# Patient Record
Sex: Male | Born: 1980 | Race: White | Hispanic: No | Marital: Married | Smoking: Never smoker
Health system: Southern US, Community
[De-identification: ages and names within clinical notes are randomized; demographics above are authoritative.]

---

## 2017-03-29 DIAGNOSIS — Z0000 Encounter for general adult medical examination without abnormal findings: Secondary | ICD-10-CM | POA: Diagnosis not present

## 2017-03-29 DIAGNOSIS — Z136 Encounter for screening for cardiovascular disorders: Secondary | ICD-10-CM | POA: Diagnosis not present

## 2017-05-29 DIAGNOSIS — Z23 Encounter for immunization: Secondary | ICD-10-CM | POA: Diagnosis not present

## 2017-06-12 DIAGNOSIS — Z0000 Encounter for general adult medical examination without abnormal findings: Secondary | ICD-10-CM | POA: Diagnosis not present

## 2017-09-17 ENCOUNTER — Ambulatory Visit
Admission: RE | Admit: 2017-09-17 | Discharge: 2017-09-17 | Disposition: A | Discharge status: Home / Self Care | Payer: 59 | Source: Ambulatory Visit | Attending: Physician Assistant | Admitting: Physician Assistant

## 2017-09-17 ENCOUNTER — Other Ambulatory Visit: Payer: Self-pay | Admitting: Physician Assistant

## 2017-09-17 DIAGNOSIS — R937 Abnormal findings on diagnostic imaging of other parts of musculoskeletal system: Secondary | ICD-10-CM | POA: Diagnosis not present

## 2017-09-17 DIAGNOSIS — M25571 Pain in right ankle and joints of right foot: Secondary | ICD-10-CM

## 2017-09-17 DIAGNOSIS — R600 Localized edema: Secondary | ICD-10-CM | POA: Diagnosis not present

## 2017-12-03 DIAGNOSIS — J351 Hypertrophy of tonsils: Secondary | ICD-10-CM | POA: Diagnosis not present

## 2018-04-26 DIAGNOSIS — L819 Disorder of pigmentation, unspecified: Secondary | ICD-10-CM | POA: Diagnosis not present

## 2018-04-26 DIAGNOSIS — D2262 Melanocytic nevi of left upper limb, including shoulder: Secondary | ICD-10-CM | POA: Diagnosis not present

## 2018-04-26 DIAGNOSIS — D225 Melanocytic nevi of trunk: Secondary | ICD-10-CM | POA: Diagnosis not present

## 2018-05-28 DIAGNOSIS — Z23 Encounter for immunization: Secondary | ICD-10-CM | POA: Diagnosis not present

## 2018-07-17 DIAGNOSIS — Z0000 Encounter for general adult medical examination without abnormal findings: Secondary | ICD-10-CM | POA: Diagnosis not present

## 2018-07-24 DIAGNOSIS — Z0000 Encounter for general adult medical examination without abnormal findings: Secondary | ICD-10-CM | POA: Diagnosis not present

## 2018-08-22 ENCOUNTER — Ambulatory Visit
Admission: EM | Admit: 2018-08-22 | Discharge: 2018-08-22 | Disposition: A | Discharge status: Home / Self Care | Payer: 59 | Attending: Physician Assistant | Admitting: Physician Assistant

## 2018-08-22 ENCOUNTER — Ambulatory Visit: Payer: 59

## 2018-08-22 DIAGNOSIS — J209 Acute bronchitis, unspecified: Secondary | ICD-10-CM

## 2018-08-22 DIAGNOSIS — R05 Cough: Secondary | ICD-10-CM | POA: Diagnosis not present

## 2018-08-22 MED ORDER — ALBUTEROL SULFATE HFA 108 (90 BASE) MCG/ACT IN AERS: 1.0000 | INHALATION_SPRAY | Freq: Four times a day (QID) | RESPIRATORY_TRACT | 0 refills | Status: AC | PRN

## 2018-08-22 MED ORDER — PREDNISONE 50 MG PO TABS: 50.0000 mg | ORAL_TABLET | Freq: Every day | ORAL | 0 refills | Status: AC

## 2018-08-22 MED ORDER — AZITHROMYCIN 250 MG PO TABS: 250.0000 mg | ORAL_TABLET | Freq: Every day | ORAL | 0 refills | Status: AC

## 2018-08-22 NOTE — Discharge Instructions (Signed)
Return if any problems.

## 2018-08-22 NOTE — ED Triage Notes (Signed)
Pt c/o cough since after christmas, body aches, sore throat and runny nose. States taken OTC meds with little relief.

## 2018-08-22 NOTE — ED Provider Notes (Addendum)
[facility]    CSN: [id number] Arrival date & time: 08/22/18  1116     History   Chief Complaint Chief Complaint  Patient presents with  . Influenza    HPI Nathaniel Jenkins. is a 38 y.o. male.   The history is provided by the patient. No language interpreter was used.  Influenza  Presenting symptoms: cough   Severity:  Moderate Onset quality:  Gradual Duration:  2 weeks Progression:  Worsening Chronicity:  New Relieved by:  Nothing Worsened by:  Nothing Ineffective treatments:  None tried Associated symptoms: decreased appetite and nasal congestion   Risk factors: being elderly   Pt complains of congestion.  Pt reports she vomitted today after coughing.   History reviewed. No pertinent past medical history.  There are no active problems to display for this patient.   History reviewed. No pertinent surgical history.     Home Medications    Prior to Admission medications   Medication Sig Start Date End Date Taking? Authorizing Provider  albuterol (PROVENTIL HFA;VENTOLIN HFA) 108 (90 Base) MCG/ACT inhaler Inhale 1-2 puffs into the lungs every 6 (six) hours as needed for wheezing or shortness of breath. 08/22/18   Fransico Meadow, PA-C  azithromycin (ZITHROMAX) 250 MG tablet Take 1 tablet (250 mg total) by mouth daily. Take first 2 tablets together, then 1 every day until finished. 08/22/18   Fransico Meadow, PA-C  predniSONE (DELTASONE) 50 MG tablet Take 1 tablet (50 mg total) by mouth daily. 08/22/18   Fransico Meadow, PA-C    Family History No family history on file.  Social History Social History   Tobacco Use  . Smoking status: Never Smoker  . Smokeless tobacco: Never Used  Substance Use Topics  . Alcohol use: Not Currently  . Drug use: Not Currently     Allergies   Patient has no known allergies.   Review of Systems Review of Systems  Constitutional: Positive for decreased appetite.  HENT: Positive for congestion.     Respiratory: Positive for cough.   All other systems reviewed and are negative.    Physical Exam Triage Vital Signs ED Triage Vitals [08/22/18 1139]  Enc Vitals Group     BP 126/82     Pulse Rate 65     Resp 18     Temp 98.1 F (36.7 C)     Temp Source Oral     SpO2 98 %     Weight      Height      Head Circumference      Peak Flow      Pain Score 0     Pain Loc      Pain Edu?      Excl. in [place]?    No data found.  Updated Vital Signs BP 126/82 (BP Location: Left Arm)   Pulse 65   Temp 98.1 F (36.7 C) (Oral)   Resp 18   SpO2 98%   Visual Acuity Right Eye Distance:   Left Eye Distance:   Bilateral Distance:    Right Eye Near:   Left Eye Near:    Bilateral Near:     Physical Exam Vitals signs reviewed.  Constitutional:      Appearance: Normal appearance.  HENT:     Head: Normocephalic.     Right Ear: Tympanic membrane normal.     Left Ear: Tympanic membrane normal.     Nose: Nose normal.     Mouth/Throat:  Mouth: Mucous membranes are moist.  Eyes:     Extraocular Movements: Extraocular movements intact.     Pupils: Pupils are equal, round, and reactive to light.  Neck:     Musculoskeletal: Normal range of motion.  Cardiovascular:     Rate and Rhythm: Normal rate.  Pulmonary:     Effort: Pulmonary effort is normal.  Abdominal:     General: Abdomen is flat.  Musculoskeletal: Normal range of motion.  Skin:    General: Skin is warm.  Neurological:     General: No focal deficit present.     Mental Status: He is alert.  Psychiatric:        Mood and Affect: Mood normal.      UC Treatments / Results  Labs (all labs ordered are listed, but only abnormal results are displayed) Labs Reviewed - No data to display  EKG None  Radiology Dg Chest 2 View  Result Date: 08/22/2018 CLINICAL DATA:  Cough. EXAM: CHEST - 2 VIEW COMPARISON:  No prior. FINDINGS: Mediastinum and hilar structures normal. Lungs are clear. No pleural effusion or  pneumothorax. Heart size normal. No acute bony abnormality. IMPRESSION: No acute cardiopulmonary disease. Electronically Signed   By: Marcello Moores  Register   On: 08/22/2018 12:31    Procedures Procedures (including critical care time)  Medications Ordered in UC Medications - No data to display  Initial Impression / Assessment and Plan / UC Course  I have reviewed the triage vital signs and the nursing notes.  Pertinent labs & imaging results that were available during my care of the patient were reviewed by me and considered in my medical decision making (see chart for details).     MDM  Chest xray normal,  Final Clinical Impressions(s) / UC Diagnoses   Final diagnoses:  Acute bronchitis, unspecified organism     Discharge Instructions     Return if any problems.    ED Prescriptions    Medication Sig Dispense Auth. Provider   azithromycin (ZITHROMAX) 250 MG tablet Take 1 tablet (250 mg total) by mouth daily. Take first 2 tablets together, then 1 every day until finished. 6 tablet Sofia, Leslie K, PA-C   predniSONE (DELTASONE) 50 MG tablet Take 1 tablet (50 mg total) by mouth daily. 5 tablet Sofia, Leslie K, PA-C   albuterol (PROVENTIL HFA;VENTOLIN HFA) 108 (90 Base) MCG/ACT inhaler Inhale 1-2 puffs into the lungs every 6 (six) hours as needed for wheezing or shortness of breath. Tyrrell, Leslie K, [place]     Controlled Substance Prescriptions [place] Controlled Substance Registry consulted? Not Applicable  An After Visit Summary was printed and given to the patient.    Fransico Meadow, PA-C 08/22/18 1859    Fransico Meadow, PA-C 08/22/18 1920

## 2019-07-28 ENCOUNTER — Ambulatory Visit: Payer: 59 | Attending: Internal Medicine

## 2019-07-28 DIAGNOSIS — Z20822 Contact with and (suspected) exposure to covid-19: Secondary | ICD-10-CM

## 2019-07-29 LAB — NOVEL CORONAVIRUS, NAA: SARS-CoV-2, NAA: NOT DETECTED

## 2019-08-21 DIAGNOSIS — Z0000 Encounter for general adult medical examination without abnormal findings: Secondary | ICD-10-CM | POA: Diagnosis not present

## 2019-08-21 DIAGNOSIS — Z13220 Encounter for screening for lipoid disorders: Secondary | ICD-10-CM | POA: Diagnosis not present

## 2019-08-28 DIAGNOSIS — Z0000 Encounter for general adult medical examination without abnormal findings: Secondary | ICD-10-CM | POA: Diagnosis not present

## 2019-09-11 ENCOUNTER — Ambulatory Visit: Payer: BC Managed Care – PPO | Attending: Internal Medicine

## 2019-09-11 DIAGNOSIS — Z20822 Contact with and (suspected) exposure to covid-19: Secondary | ICD-10-CM | POA: Diagnosis not present

## 2019-09-12 LAB — NOVEL CORONAVIRUS, NAA: SARS-CoV-2, NAA: NOT DETECTED

## 2020-07-31 IMAGING — DX DG CHEST 2V
2 series · 2 of 2 positions shown · non-contrast
Comparison: No prior.

CLINICAL DATA: Cough.

EXAM:
CHEST - 2 VIEW

[chest pa]
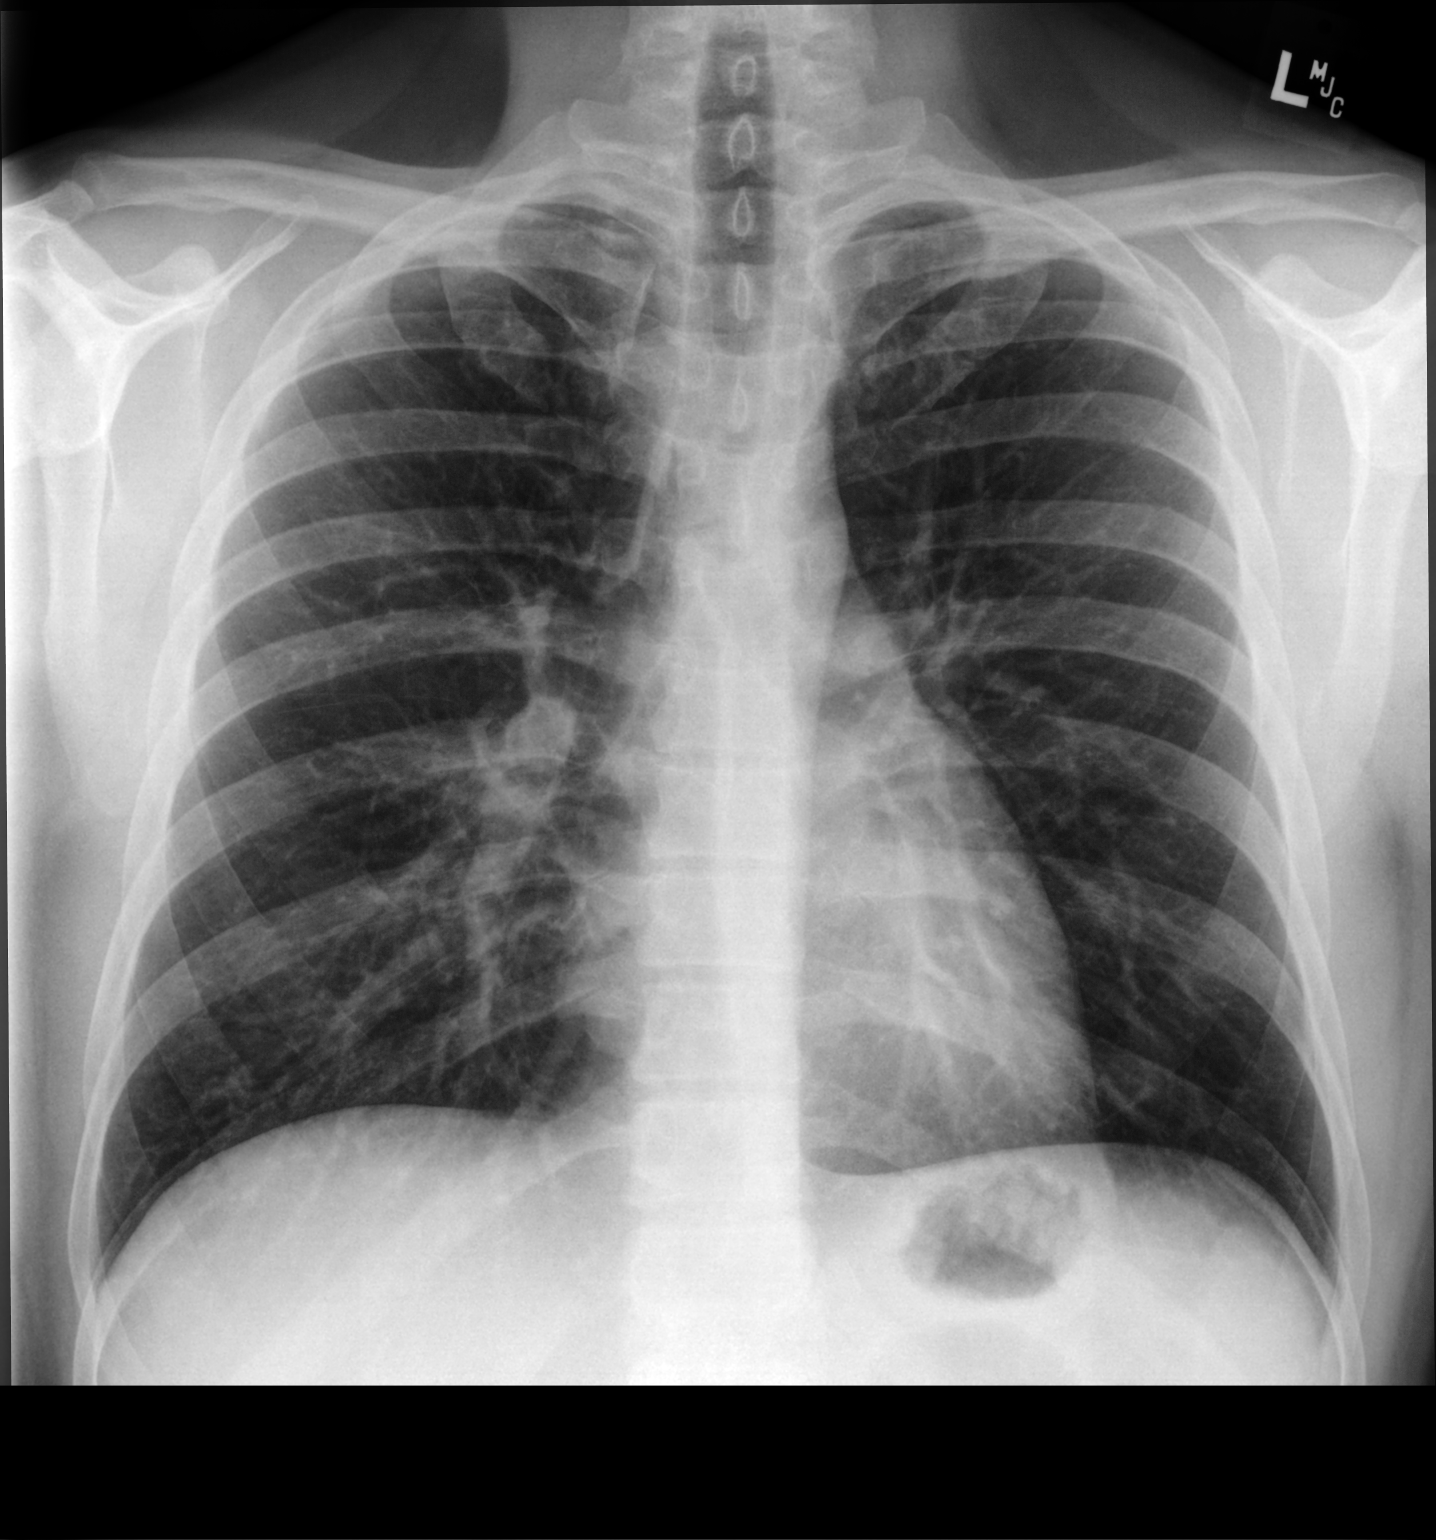

[chest lat]
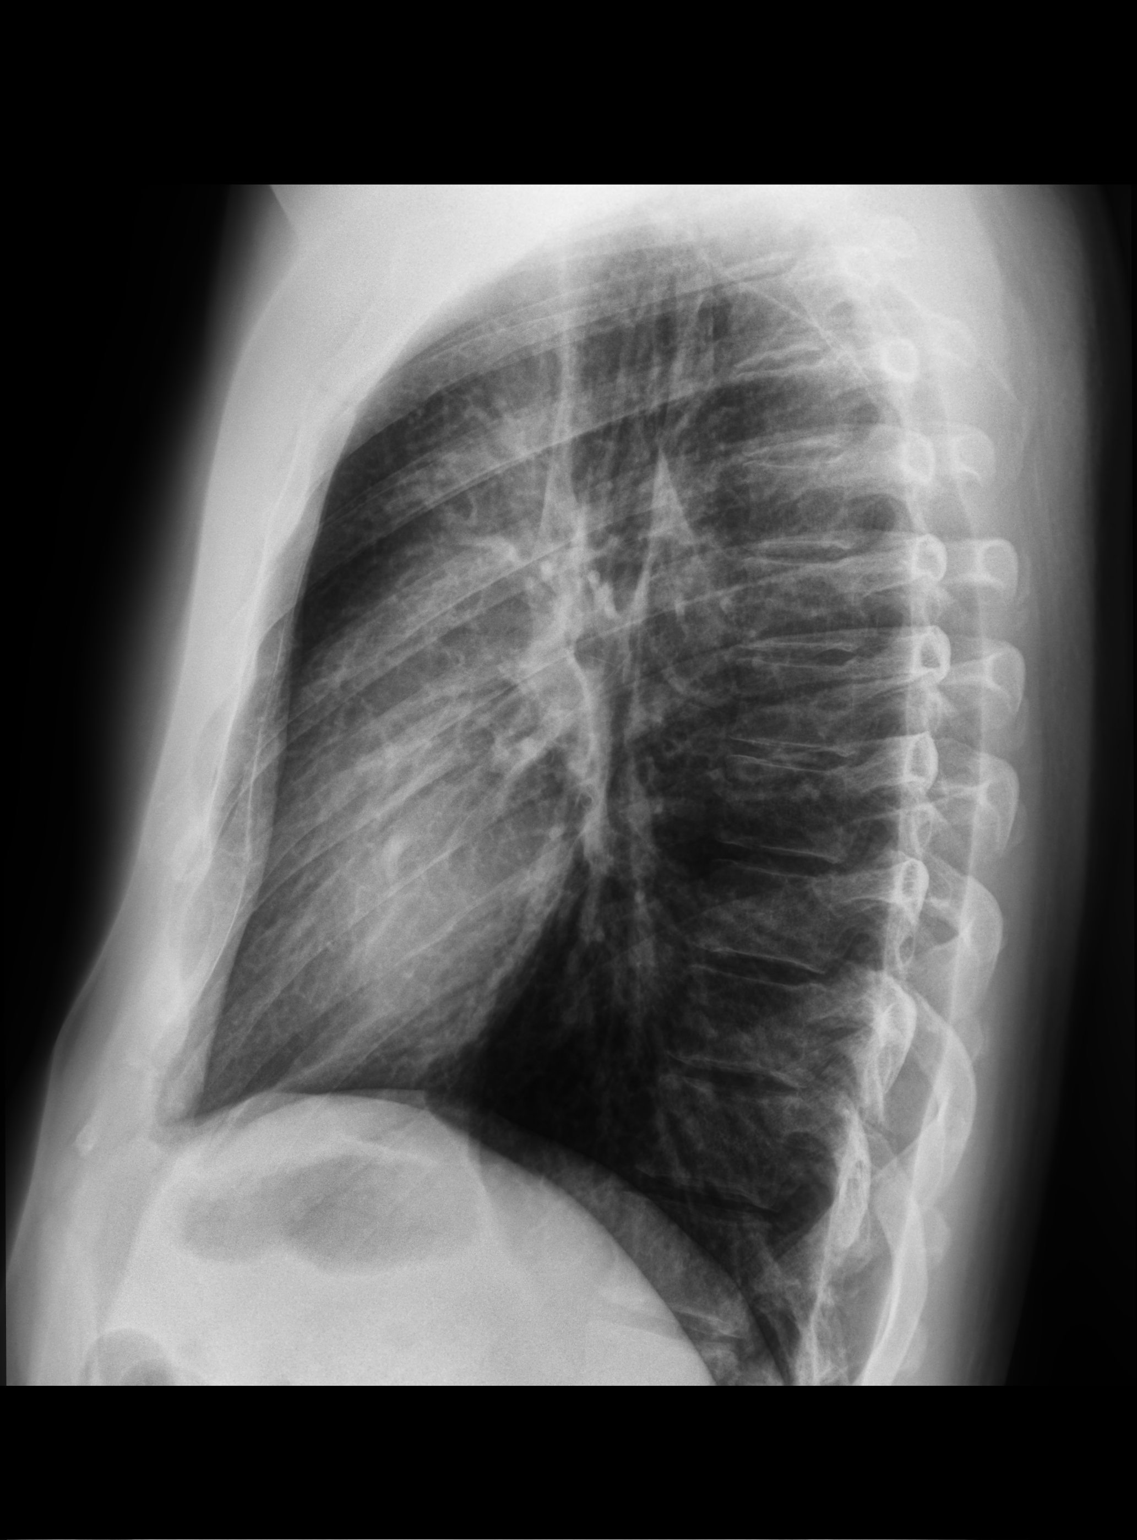

[2 of 2 positions shown; findings below may reference images not displayed]

FINDINGS: Mediastinum and hilar structures normal. Lungs are clear. No pleural
effusion or pneumothorax. Heart size normal. No acute bony
abnormality.
IMPRESSION: No acute cardiopulmonary disease.

## 2020-08-12 ENCOUNTER — Other Ambulatory Visit: Payer: BC Managed Care – PPO

## 2020-08-12 DIAGNOSIS — Z20822 Contact with and (suspected) exposure to covid-19: Secondary | ICD-10-CM

## 2020-08-14 LAB — NOVEL CORONAVIRUS, NAA: SARS-CoV-2, NAA: DETECTED — AB

## 2020-08-14 LAB — SARS-COV-2, NAA 2 DAY TAT

## 2020-08-16 ENCOUNTER — Other Ambulatory Visit: Payer: BC Managed Care – PPO

## 2020-08-31 DIAGNOSIS — Z0000 Encounter for general adult medical examination without abnormal findings: Secondary | ICD-10-CM | POA: Diagnosis not present

## 2020-09-09 DIAGNOSIS — Z0000 Encounter for general adult medical examination without abnormal findings: Secondary | ICD-10-CM | POA: Diagnosis not present

## 2021-01-05 DIAGNOSIS — Z1159 Encounter for screening for other viral diseases: Secondary | ICD-10-CM | POA: Diagnosis not present

## 2021-09-28 DIAGNOSIS — Z0000 Encounter for general adult medical examination without abnormal findings: Secondary | ICD-10-CM | POA: Diagnosis not present

## 2021-09-29 DIAGNOSIS — T148XXA Other injury of unspecified body region, initial encounter: Secondary | ICD-10-CM | POA: Diagnosis not present

## 2021-09-29 DIAGNOSIS — Z0000 Encounter for general adult medical examination without abnormal findings: Secondary | ICD-10-CM | POA: Diagnosis not present

## 2022-10-05 DIAGNOSIS — Z125 Encounter for screening for malignant neoplasm of prostate: Secondary | ICD-10-CM | POA: Diagnosis not present

## 2022-10-05 DIAGNOSIS — Z0000 Encounter for general adult medical examination without abnormal findings: Secondary | ICD-10-CM | POA: Diagnosis not present

## 2022-10-12 DIAGNOSIS — Z0000 Encounter for general adult medical examination without abnormal findings: Secondary | ICD-10-CM | POA: Diagnosis not present

## 2023-09-03 DIAGNOSIS — Z7189 Other specified counseling: Secondary | ICD-10-CM | POA: Diagnosis not present

## 2023-09-18 DIAGNOSIS — Z23 Encounter for immunization: Secondary | ICD-10-CM | POA: Diagnosis not present

## 2023-09-19 DIAGNOSIS — Z3009 Encounter for other general counseling and advice on contraception: Secondary | ICD-10-CM | POA: Diagnosis not present

## 2023-10-16 DIAGNOSIS — Z23 Encounter for immunization: Secondary | ICD-10-CM | POA: Diagnosis not present

## 2023-10-25 DIAGNOSIS — Z0000 Encounter for general adult medical examination without abnormal findings: Secondary | ICD-10-CM | POA: Diagnosis not present

## 2023-10-26 DIAGNOSIS — Z302 Encounter for sterilization: Secondary | ICD-10-CM | POA: Diagnosis not present

## 2023-11-13 DIAGNOSIS — Z0000 Encounter for general adult medical examination without abnormal findings: Secondary | ICD-10-CM | POA: Diagnosis not present
